# Patient Record
Sex: Female | Born: 1990 | Race: White | Hispanic: No | Marital: Single | Smoking: Never smoker
Health system: Southern US, Community
[De-identification: ages and names within clinical notes are randomized; demographics above are authoritative.]

## PROBLEM LIST (undated history)

## (undated) DIAGNOSIS — S42309A Unspecified fracture of shaft of humerus, unspecified arm, initial encounter for closed fracture: Secondary | ICD-10-CM

## (undated) HISTORY — PX: OTHER SURGICAL HISTORY: SHX169

---

## 2014-12-10 ENCOUNTER — Other Ambulatory Visit (HOSPITAL_COMMUNITY)
Admission: RE | Admit: 2014-12-10 | Discharge: 2014-12-10 | Disposition: A | Discharge status: Home / Self Care | Payer: 59 | Source: Ambulatory Visit | Attending: Obstetrics & Gynecology | Admitting: Obstetrics & Gynecology

## 2014-12-10 DIAGNOSIS — Z113 Encounter for screening for infections with a predominantly sexual mode of transmission: Secondary | ICD-10-CM | POA: Insufficient documentation

## 2014-12-10 DIAGNOSIS — Z01419 Encounter for gynecological examination (general) (routine) without abnormal findings: Secondary | ICD-10-CM | POA: Diagnosis present

## 2014-12-21 ENCOUNTER — Emergency Department (INDEPENDENT_AMBULATORY_CARE_PROVIDER_SITE_OTHER): Payer: 59

## 2014-12-21 ENCOUNTER — Encounter (HOSPITAL_COMMUNITY): Payer: Self-pay | Admitting: Emergency Medicine

## 2014-12-21 ENCOUNTER — Emergency Department (HOSPITAL_COMMUNITY)
Admission: EM | Admit: 2014-12-21 | Discharge: 2014-12-21 | Disposition: A | Discharge status: Home / Self Care | Payer: 59 | Source: Home / Self Care | Attending: Family Medicine | Admitting: Family Medicine

## 2014-12-21 DIAGNOSIS — S93402A Sprain of unspecified ligament of left ankle, initial encounter: Secondary | ICD-10-CM

## 2014-12-21 HISTORY — DX: Unspecified fracture of shaft of humerus, unspecified arm, initial encounter for closed fracture: S42.309A

## 2014-12-21 NOTE — ED Provider Notes (Signed)
CSN: [id number]     Arrival date & time 12/21/14  1618 History   First MD Initiated Contact with Patient 12/21/14 1640     No chief complaint on file.  (Consider location/radiation/quality/duration/timing/severity/associated sxs/prior Treatment) The history is provided by the patient.   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 Last Wednesday when she was playing this sport, she turned her left ankle and she's had pain and swelling ever since. She feels as if it's unstable. She is able to bear her weight as long as she does not markedly dorsiflex her ankle.  No past medical history on file. No past surgical history on file. No family history on file. Social History  Substance Use Topics  . Smoking status: Not on file  . Smokeless tobacco: Not on file  . Alcohol Use: Not on file   OB History    No data available     Review of Systems  Constitutional: Negative.   HENT: Negative.   Respiratory: Negative.   Cardiovascular: Negative.   Genitourinary: Negative.   Neurological: Negative.   Psychiatric/Behavioral: Negative.     Allergies  Review of patient's allergies indicates not on file.  Home Medications   Prior to Admission medications   Not on File   Meds Ordered and Administered this Visit  Medications - No data to display  There were no vitals taken for this visit. No data found.   Physical Exam  Constitutional: She is oriented to person, place, and time. She appears well-developed and well-nourished.  HENT:  Head: Normocephalic and atraumatic.  Right Ear: External ear normal.  Left Ear: External ear normal.  Eyes: Conjunctivae and EOM are normal. Pupils are equal, round, and reactive to light.  Neck: Normal range of motion. Neck supple.  Musculoskeletal:  Left lateral malleolus is swollen and there is an ecchymotic line that parallels the lateral plantar surface of her foot. She does have  some tenderness with palpation of the lateral malleolus but there is no bony abnormality.  Neurological: She is alert and oriented to person, place, and time.  Skin: Skin is warm and dry.  Nursing note and vitals reviewed.   ED Course  Procedures (including critical care time) Left ankle film is negative  MDM   ASO splint for a week, and then when needed for sports  Signed, Elvina Sidle, MD   Elvina Sidle, MD 12/21/14 [phone]

## 2014-12-21 NOTE — ED Notes (Signed)
Reports spraining left ankle on Wednesday.  Patient was wearing cleats when she twisted her ankle.  Visible bruising.  Patient limping with walking/weight bearing

## 2014-12-21 NOTE — Discharge Instructions (Signed)
Use the ankle brace for sports for the next week and then as needed in the future for recurrent sprain    Ankle Sprain An ankle sprain is an injury to the strong, fibrous tissues (ligaments) that hold the bones of your ankle joint together.  CAUSES An ankle sprain is usually caused by a fall or by twisting your ankle. Ankle sprains most commonly occur when you step on the outer edge of your foot, and your ankle turns inward. People who participate in sports are more prone to these types of injuries.  SYMPTOMS   Pain in your ankle. The pain may be present at rest or only when you are trying to stand or walk.  Swelling.  Bruising. Bruising may develop immediately or within 1 to 2 days after your injury.  Difficulty standing or walking, particularly when turning corners or changing directions. DIAGNOSIS  Your caregiver will ask you details about your injury and perform a physical exam of your ankle to determine if you have an ankle sprain. During the physical exam, your caregiver will press on and apply pressure to specific areas of your foot and ankle. Your caregiver will try to move your ankle in certain ways. An X-ray exam may be done to be sure a bone was not broken or a ligament did not separate from one of the bones in your ankle (avulsion fracture).  TREATMENT  Certain types of braces can help stabilize your ankle. Your caregiver can make a recommendation for this. Your caregiver may recommend the use of medicine for pain. If your sprain is severe, your caregiver may refer you to a surgeon who helps to restore function to parts of your skeletal system (orthopedist) or a physical therapist. HOME CARE INSTRUCTIONS   Apply ice to your injury for 1-2 days or as directed by your caregiver. Applying ice helps to reduce inflammation and pain.  Put ice in a plastic bag.  Place a towel between your skin and the bag.  Leave the ice on for 15-20 minutes at a time, every 2 hours while you are  awake.  Only take over-the-counter or prescription medicines for pain, discomfort, or fever as directed by your caregiver.  Elevate your injured ankle above the level of your heart as much as possible for 2-3 days.  If your caregiver recommends crutches, use them as instructed. Gradually put weight on the affected ankle. Continue to use crutches or a cane until you can walk without feeling pain in your ankle.  If you have a plaster splint, wear the splint as directed by your caregiver. Do not rest it on anything harder than a pillow for the first 24 hours. Do not put weight on it. Do not get it wet. You may take it off to take a shower or bath.  You may have been given an elastic bandage to wear around your ankle to provide support. If the elastic bandage is too tight (you have numbness or tingling in your foot or your foot becomes cold and blue), adjust the bandage to make it comfortable.  If you have an air splint, you may blow more air into it or let air out to make it more comfortable. You may take your splint off at night and before taking a shower or bath. Wiggle your toes in the splint several times per day to decrease swelling. SEEK MEDICAL CARE IF:   You have rapidly increasing bruising or swelling.  Your toes feel extremely cold or you lose feeling  in your foot.  Your pain is not relieved with medicine. SEEK IMMEDIATE MEDICAL CARE IF:  Your toes are numb or blue.  You have severe pain that is increasing. MAKE SURE YOU:   Understand these instructions.  Will watch your condition.  Will get help right away if you are not doing well or get worse. Document Released: 03/28/2005 Document Revised: 12/21/2011 Document Reviewed: 04/09/2011 [facility] Patient Information 2015 [place], [place]. This information is not intended to replace advice given to you by your health care provider. Make sure you discuss any questions you have with your health care provider.

## 2015-05-04 ENCOUNTER — Encounter (HOSPITAL_COMMUNITY): Payer: Self-pay

## 2015-05-04 ENCOUNTER — Emergency Department (HOSPITAL_COMMUNITY)
Admission: EM | Admit: 2015-05-04 | Discharge: 2015-05-04 | Disposition: A | Discharge status: Home / Self Care | Payer: Self-pay | Attending: Emergency Medicine | Admitting: Emergency Medicine

## 2015-05-04 ENCOUNTER — Emergency Department (HOSPITAL_COMMUNITY): Payer: Self-pay

## 2015-05-04 DIAGNOSIS — Y998 Other external cause status: Secondary | ICD-10-CM | POA: Insufficient documentation

## 2015-05-04 DIAGNOSIS — Y9289 Other specified places as the place of occurrence of the external cause: Secondary | ICD-10-CM | POA: Insufficient documentation

## 2015-05-04 DIAGNOSIS — M25561 Pain in right knee: Secondary | ICD-10-CM

## 2015-05-04 DIAGNOSIS — S8991XA Unspecified injury of right lower leg, initial encounter: Secondary | ICD-10-CM | POA: Insufficient documentation

## 2015-05-04 DIAGNOSIS — Y9369 Activity, other involving other sports and athletics played as a team or group: Secondary | ICD-10-CM | POA: Insufficient documentation

## 2015-05-04 DIAGNOSIS — W500XXA Accidental hit or strike by another person, initial encounter: Secondary | ICD-10-CM | POA: Insufficient documentation

## 2015-05-04 MED ORDER — OXYCODONE-ACETAMINOPHEN 5-325 MG PO TABS: 1.0000 | ORAL_TABLET | Freq: Four times a day (QID) | ORAL | Status: DC | PRN

## 2015-05-04 NOTE — ED Notes (Signed)
Pt was playing a contact sport and collided with another player, R knee twisted inward, pain 5/10, pt states she has not been abel to walk on it.

## 2015-05-04 NOTE — Progress Notes (Signed)
Orthopedic Tech Progress Note Patient Details:  Veronica Robertson 1990-06-30 [id number]  Ortho Devices Type of Ortho Device: Knee Immobilizer Ortho Device/Splint Location: rle Ortho Device/Splint Interventions: Ordered, Application   Trinna Post 05/04/2015, 11:13 PM

## 2015-05-04 NOTE — Discharge Instructions (Signed)
Call orthopedic clinic today or tomorrow to schedule an appointment. Let them know you were seen in the ER and told to follow up at the earliest appointment.   Wear knee immobilizer until you are seen by the orthopedic clinic listed. Use crutches as needed for comfort. Ice and elevate knee throughout the day. Alternate between ibuprofen/motrin and pain medication for pain relief. Do not drive or operate machinery with pain medication use. Return to ER for any new or worsening symptoms, any additional concerns.

## 2015-05-04 NOTE — ED Provider Notes (Signed)
CSN: [id number]     Arrival date & time 05/04/15  2143 History  By signing my name below, I, Gonzella Lex, attest that this documentation has been prepared under the direction and in the presence of [facility], PA-C. Electronically Signed: Gonzella Lex, Scribe. 05/04/2015. 10:36 PM.   Chief Complaint  Patient presents with  . Knee Pain   The history is provided by the patient. No language interpreter was used.   HPI Comments: Veronica Robertson is a 25 y.o. female who presents to the Emergency Department complaining of sudden onset, constant, right knee pain following a collision with a significantly larger female while playing a coed contact sport an hour and a half ago. Pt reports associated swelling and states that her knee pain is worse laterally. She also notes that her right knee twisted inward but is unsure of any sort of pop due to the noise around her at the time of the injury. Unable to bear weight. No history of prior knee surgeries/injuries.  Past Medical History  Diagnosis Date  . Broken arm     left   History reviewed. No pertinent past surgical history. No family history on file. Social History  Substance Use Topics  . Smoking status: Never Smoker   . Smokeless tobacco: None  . Alcohol Use: Yes   OB History    No data available     Review of Systems  Constitutional: Negative for fever.  HENT: Negative for congestion.   Eyes: Negative for visual disturbance.  Respiratory: Negative for shortness of breath.   Cardiovascular: Negative for chest pain.  Gastrointestinal: Negative for abdominal pain.  Genitourinary: Negative for difficulty urinating.  Musculoskeletal: Positive for myalgias, joint swelling and arthralgias.  Allergic/Immunologic: Negative for immunocompromised state.  Neurological: Negative for syncope.    Allergies  Review of patient's allergies indicates no known allergies.  Home Medications   Prior to Admission medications    Medication Sig Start Date End Date Taking? Authorizing Provider  oxyCODONE-acetaminophen (PERCOCET/ROXICET) 5-325 MG tablet Take 1 tablet by mouth every 6 (six) hours as needed for severe pain. 05/04/15   Jaime Pilcher Ward, PA-C   BP 114/69 mmHg  Pulse 83  Temp(Src) 98.5 F (36.9 C) (Oral)  Resp 20  Ht  (1.626 m)  Wt 82.555 kg  BMI 31.22 kg/m2  SpO2 100%  LMP 04/27/2015 Physical Exam  Constitutional: She is oriented to person, place, and time. She appears well-developed and well-nourished. No distress.  HENT:  Head: Normocephalic and atraumatic.  Neck: Neck supple. No tracheal deviation present.  Cardiovascular: Normal rate, regular rhythm and normal heart sounds.   Pulmonary/Chest: Effort normal and breath sounds normal. No respiratory distress. She has no wheezes. She has no rales.  Abdominal: Soft. She exhibits no distension. There is no tenderness.  Musculoskeletal:       Right knee: She exhibits decreased range of motion and swelling. She exhibits no effusion, no ecchymosis, no deformity, no laceration and no erythema. Tenderness found. Lateral joint line and LCL tenderness noted. No patellar tendon tenderness noted.  + McMurray of right knee (-) anterior and posterior drawers  Neurological: She is alert and oriented to person, place, and time.  Skin: Skin is warm and dry. No rash noted.  Psychiatric: She has a normal mood and affect.  Nursing note and vitals reviewed.   ED Course  Procedures  DIAGNOSTIC STUDIES:    Oxygen Saturation is 100% on RA, normal by my interpretation.   COORDINATION OF  CARE:  10:36 PM Will review x ray. Discussed treatment plan with pt at bedside and pt agreed to plan.   Imaging Review Dg Knee Complete 4 Views Right  05/04/2015  CLINICAL DATA:  Twisting injury right knee today. Initial encounter. EXAM: RIGHT KNEE - COMPLETE 4+ VIEW COMPARISON:  None. FINDINGS: There is no evidence of fracture, dislocation, or joint effusion. There is  no evidence of arthropathy or other focal bone abnormality. Soft tissues are unremarkable. IMPRESSION: Negative exam. Electronically Signed   By: Drusilla Kanner M.D.   On: 05/04/2015 22:31   I have personally reviewed and evaluated these images as part of my medical decision-making.    MDM   Final diagnoses:  Right knee pain   Veronica Robertson presents with right knee pain and swelling after injury while playing contact sport today. Patient with negative x-ray. Exam shows TTP of lateral joint line and tenderness along LCL. + McMurray. Patient placed in knee immobilizer and advised to call orthopedic clinic first thing in the morning for appointment with them for further evaluation and management. Home care instructions given. Patient states they have crutches at home and will therefore use their own. Return and follow up instructions discussed - all questions answered.   I personally performed the services described in this documentation, which was scribed in my presence. The recorded information has been reviewed and is accurate.  [facility] Ward, PA-C 05/04/15 2313  Vanetta Mulders, MD 05/05/15 1630

## 2015-05-20 ENCOUNTER — Other Ambulatory Visit: Payer: Self-pay | Admitting: Sports Medicine

## 2015-05-20 DIAGNOSIS — M25561 Pain in right knee: Secondary | ICD-10-CM

## 2015-05-24 ENCOUNTER — Ambulatory Visit
Admission: RE | Admit: 2015-05-24 | Discharge: 2015-05-24 | Disposition: A | Discharge status: Home / Self Care | Payer: BLUE CROSS/BLUE SHIELD | Source: Ambulatory Visit | Attending: Sports Medicine | Admitting: Sports Medicine

## 2015-05-24 DIAGNOSIS — M25561 Pain in right knee: Secondary | ICD-10-CM

## 2016-05-30 IMAGING — MR MR KNEE*R* W/O CM
6 series · 34 of 40 positions shown · non-contrast
Comparison: Radiographs 05/04/2015.

CLINICAL DATA: Lateral stress injury to the knee 3 weeks ago with
limited range of motion and pain. No previous relevant surgery.

EXAM:
MRI OF THE RIGHT KNEE WITHOUT CONTRAST
TECHNIQUE: Multiplanar, multisequence MR imaging of the knee was performed. No
intravenous contrast was administered.

[Series 6: PD fat-sat · axial · right · 3.0mm · 0.39mm/px · z∈[-61,+53]mm · 7 of 36 slices shown (1 of 3)]
[im 1/36]
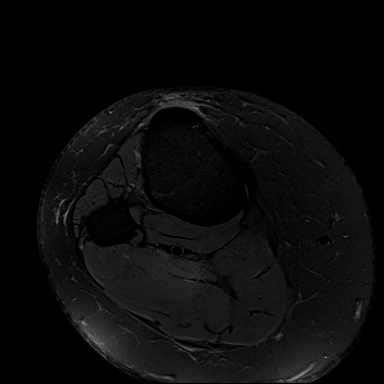
[im 6/36]
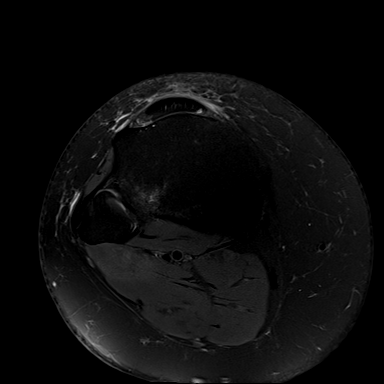
[im 12/36]
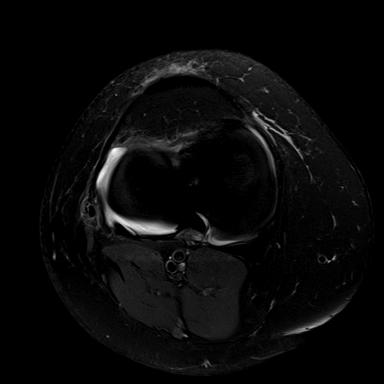
[im 18/36]
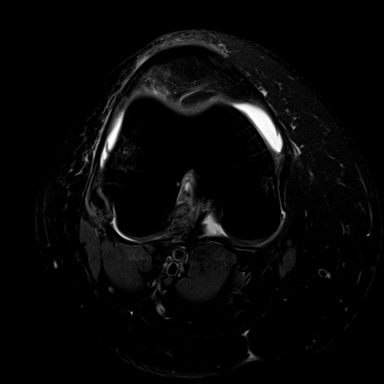
[im 24/36]
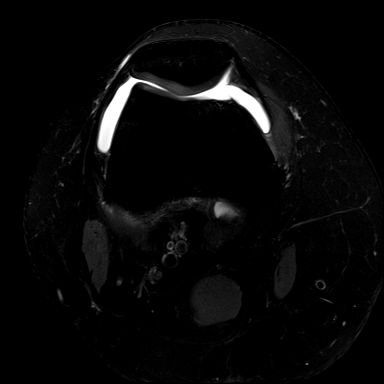
[im 30/36]
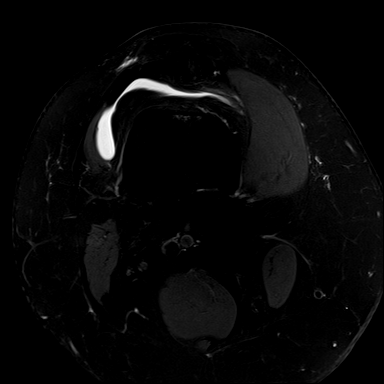
[im 36/36]
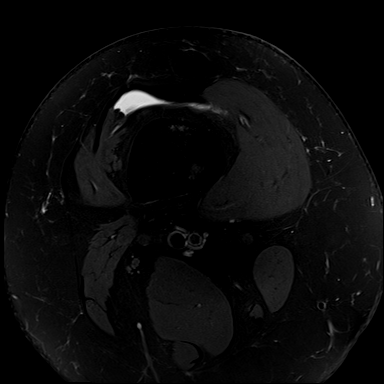

[Series 7: T1 · coronal · right · 3.0mm · 0.33mm/px · 1 of 29 slices shown]
[im 1/29]
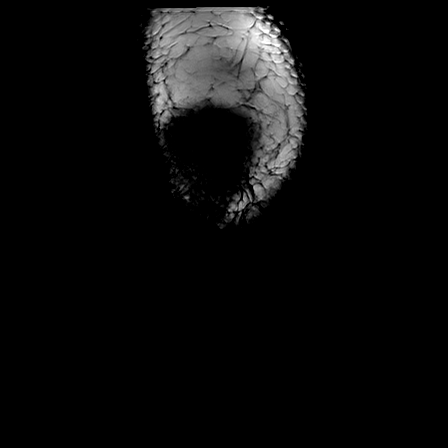

[Series 8: PD fat-sat · coronal · right · 3.0mm · 0.39mm/px · 8 of 33 slices shown (2 of 3)]
[im 1/33]
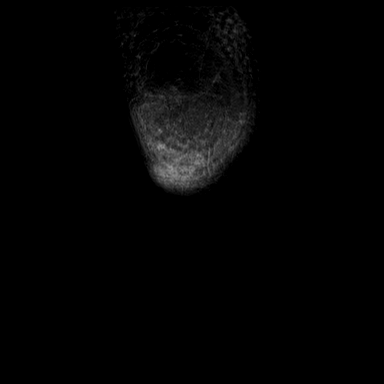
[im 5/33]
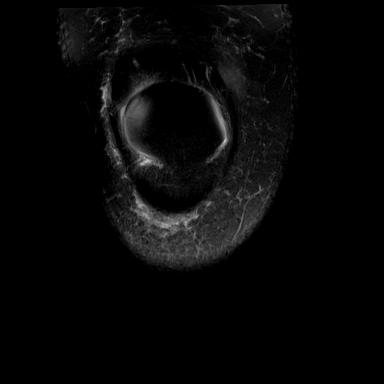
[im 10/33]
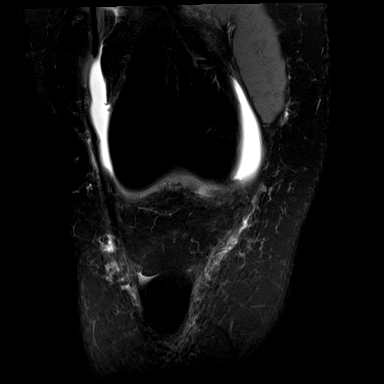
[im 14/33]
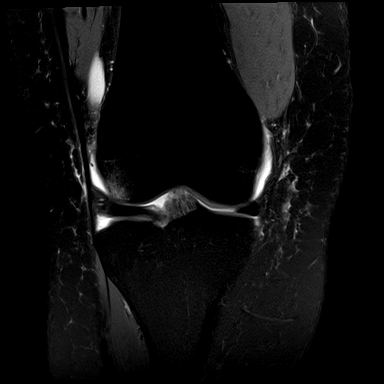
[im 19/33]
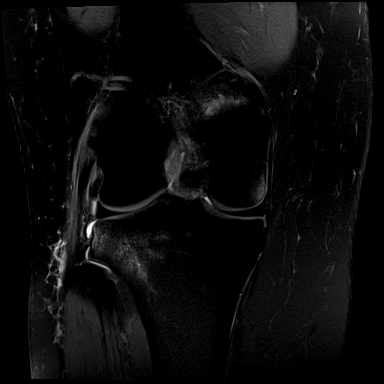
[im 23/33]
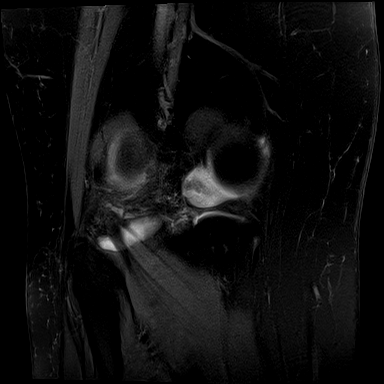
[im 28/33]
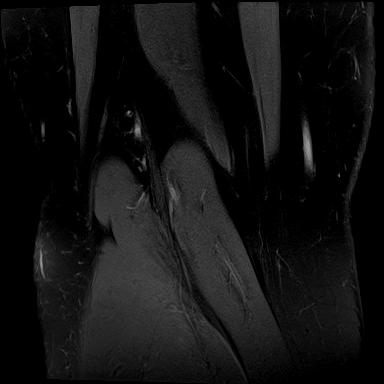
[im 33/33]
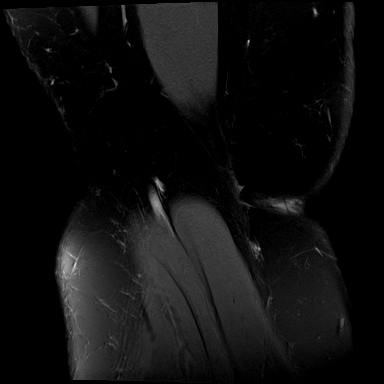

[Series 9: PD fat-sat · sagittal · right · 3.0mm · 0.39mm/px · 6 of 26 slices shown (3 of 3)]
[im 1/26]
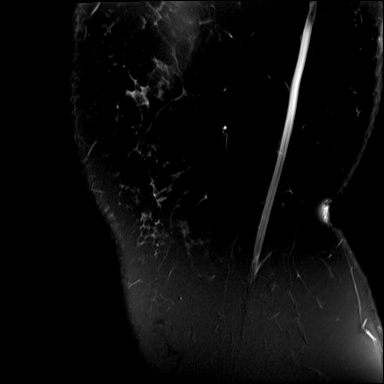
[im 6/26]
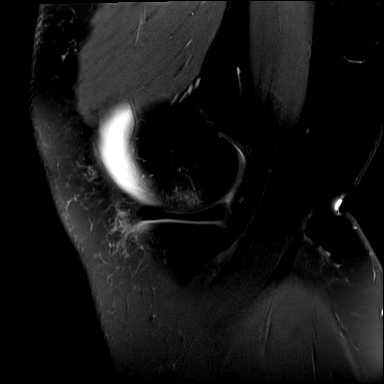
[im 11/26]
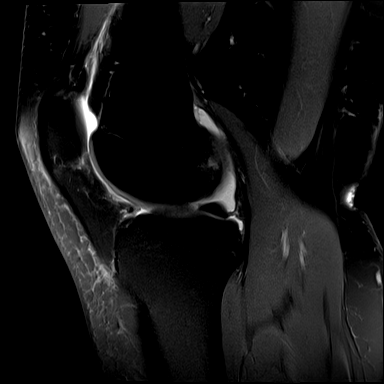
[im 16/26]
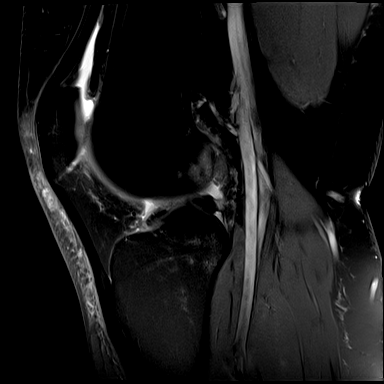
[im 21/26]
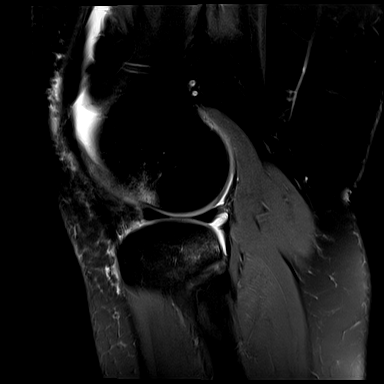
[im 26/26]
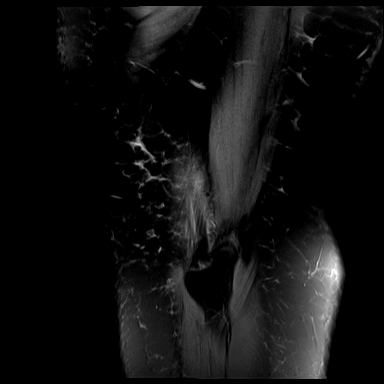

[Series 10: T2 fat-sat · coronal · right · 3.0mm · 0.39mm/px · 8 of 33 slices shown]
[im 1/33]
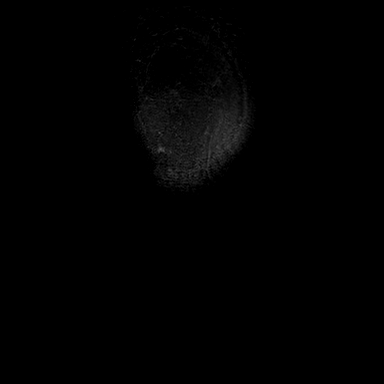
[im 5/33]
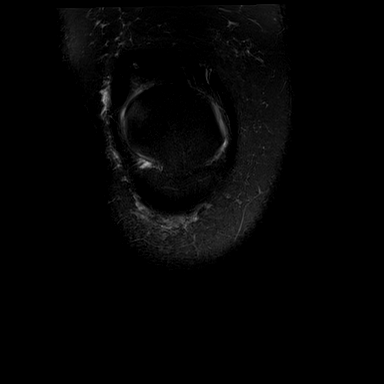
[im 10/33]
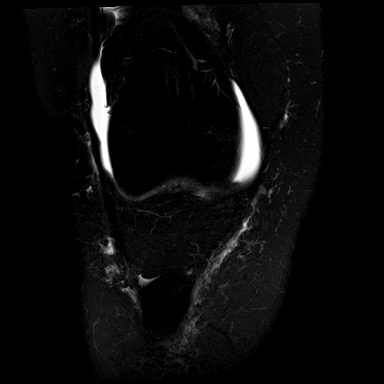
[im 14/33]
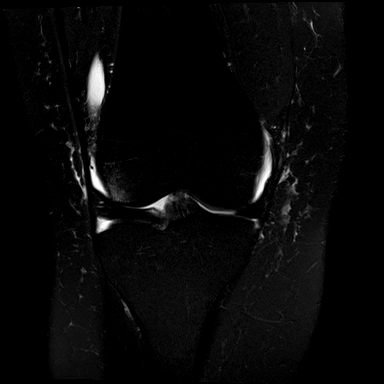
[im 19/33]
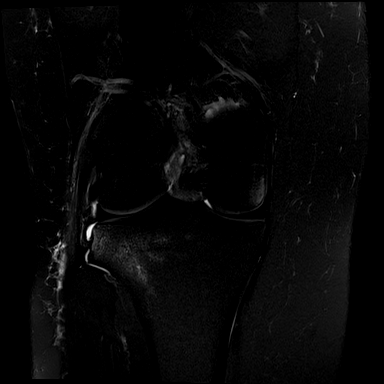
[im 23/33]
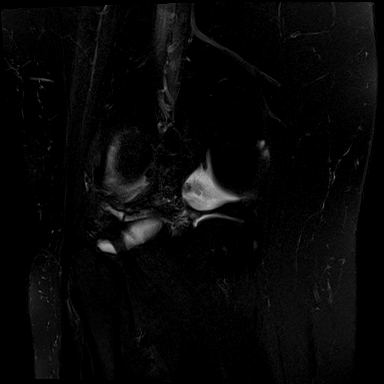
[im 28/33]
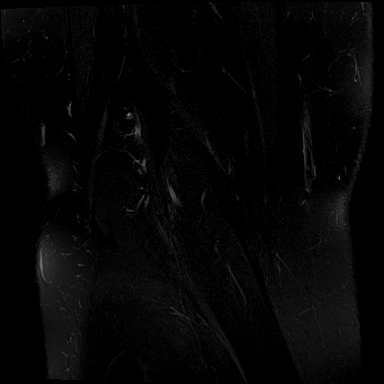
[im 33/33]
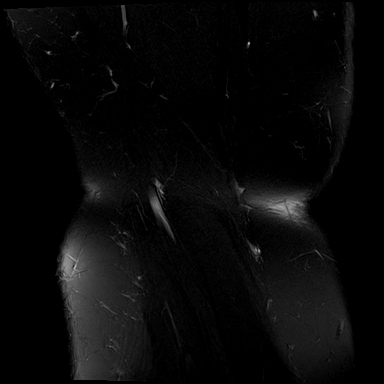

[Series 11: PD · coronal · right · 2.0mm · 0.44mm/px · 4 of 17 slices shown]
[im 1/17]
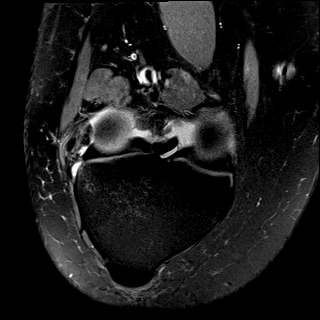
[im 6/17]
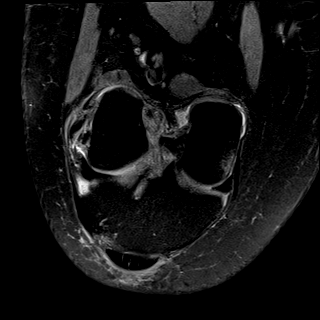
[im 11/17]
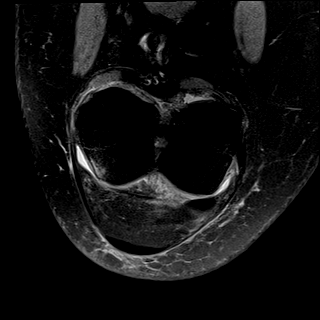
[im 17/17]
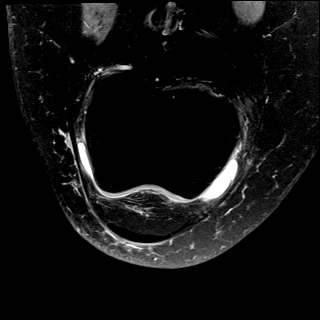

[34 of 40 positions shown; findings below may reference images not displayed]

FINDINGS: MENISCI

Medial meniscus:  Intact with normal morphology.

Lateral meniscus:  Intact with normal morphology.

LIGAMENTS

Cruciates: The anterior cruciate ligament is completely torn. The
PCL is intact. There is edema/ hemorrhage in the intercondylar
notch.

Collaterals: The medial collateral ligament appears normal. The
fibular collateral ligament is thickened with surrounding edema. The
iliotibial band, biceps tendon and popliteus tendon appear normal.

CARTILAGE

Patellofemoral:  Preserved.

Medial:  Preserved.

Lateral:  Preserved.

OTHER

Joint:  Moderate-sized joint effusion.

Popliteal Fossa:  Unremarkable. No significant Baker's cyst.

Extensor Mechanism: Intact. There is prominent subcutaneous edema
anterior to the patella and patellar tendon.

Bones: There are acute bone contusions involving the lateral femoral
condyle anteriorly, the medial femoral condyle peripherally and the
lateral tibial plateau posteriorly. No cortical fracture or
osteochondral injury identified.
IMPRESSION: 1. Acute complete tear of the anterior cruciate ligament.
2. Grade 2 fibular collateral ligament injury.
3. Associated typical osseous injuries of pivot-shift mechanism with
hemarthrosis.
4. The MCL, PCL and menisci appear intact.

## 2016-08-22 ENCOUNTER — Ambulatory Visit (INDEPENDENT_AMBULATORY_CARE_PROVIDER_SITE_OTHER): Payer: BC Managed Care – PPO | Admitting: Orthopaedic Surgery

## 2016-08-22 ENCOUNTER — Encounter (INDEPENDENT_AMBULATORY_CARE_PROVIDER_SITE_OTHER): Payer: Self-pay | Admitting: Orthopaedic Surgery

## 2016-08-22 DIAGNOSIS — S83511A Sprain of anterior cruciate ligament of right knee, initial encounter: Secondary | ICD-10-CM | POA: Diagnosis not present

## 2016-08-22 NOTE — Progress Notes (Signed)
   Office Visit Note   Patient: Veronica Robertson           Date of Birth: [birth year]           MRN: [id number] Visit Date: 08/22/2016              Requested by: No referring provider defined for this encounter. 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 Follow-up as needed.  Follow-Up Instructions: Return if symptoms worsen or fail to improve.   Orders:  No orders of the defined types were placed in this encounter.  No orders of the defined types were placed in this encounter.     Procedures: No procedures performed   Clinical Data: No additional findings.   Subjective: Chief Complaint  Patient presents with  . Right Knee - Pain    Patient comes in today for follow-up of her right chronic anterior cruciate ligament rupture. She finished physical therapy and is wearing an anterior cruciate ligament brace. She denies any pain. Occasionally she'll have some instability. She is able to participate in a roller derby without any issues. She is mainly here to see if she can get another brace will fit better. She has lost weight recently.    Review of Systems  Constitutional: Negative.   HENT: Negative.   Eyes: Negative.   Respiratory: Negative.   Cardiovascular: Negative.   Endocrine: Negative.   Musculoskeletal: Negative.   Neurological: Negative.   Hematological: Negative.   Psychiatric/Behavioral: Negative.   All other systems reviewed and are negative.    Objective: Vital Signs: There were no vitals taken for this visit.  Physical Exam  Ortho Exam Right knee exam is benign. Lachman testing is stable. Specialty Comments:  No specialty comments available.  Imaging: No results found.   PMFS History: Patient Active Problem List   Diagnosis Date Noted  . 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 Broken arm    left    No family history on file.  No past surgical history on file. Social History   Occupational History  . Not on file.   Social History Main Topics  . Smoking status: Never Smoker  . Smokeless tobacco: Never Used  . Alcohol use Yes  . Drug use: No  . Sexual activity: Not on file

## 2016-09-09 ENCOUNTER — Telehealth (INDEPENDENT_AMBULATORY_CARE_PROVIDER_SITE_OTHER): Payer: Self-pay | Admitting: *Deleted

## 2016-09-09 NOTE — Telephone Encounter (Signed)
Patient called in this afternoon in regards to needing to get in contact with [facility]? To get fitted for a new brace because she has recently lost a lot of weight and needs to be fitted for a new brace. Her CB # [phone]. Thank you

## 2016-09-12 NOTE — Telephone Encounter (Signed)
ACL brace

## 2016-09-12 NOTE — Telephone Encounter (Signed)
Emailed ryan. He will get in contact with patient. Called pt to advise, no answer LMOM with details.

## 2016-09-12 NOTE — Telephone Encounter (Signed)
Is this okay if so what would you like the order to say?

## 2017-05-07 ENCOUNTER — Emergency Department (HOSPITAL_BASED_OUTPATIENT_CLINIC_OR_DEPARTMENT_OTHER): Payer: BC Managed Care – PPO

## 2017-05-07 ENCOUNTER — Encounter (HOSPITAL_BASED_OUTPATIENT_CLINIC_OR_DEPARTMENT_OTHER): Payer: Self-pay | Admitting: Emergency Medicine

## 2017-05-07 ENCOUNTER — Other Ambulatory Visit: Payer: Self-pay

## 2017-05-07 ENCOUNTER — Emergency Department (HOSPITAL_BASED_OUTPATIENT_CLINIC_OR_DEPARTMENT_OTHER)
Admission: EM | Admit: 2017-05-07 | Discharge: 2017-05-07 | Disposition: A | Discharge status: Home / Self Care | Payer: BC Managed Care – PPO | Attending: Emergency Medicine | Admitting: Emergency Medicine

## 2017-05-07 DIAGNOSIS — S4351XA Sprain of right acromioclavicular joint, initial encounter: Secondary | ICD-10-CM

## 2017-05-07 DIAGNOSIS — Y92838 Other recreation area as the place of occurrence of the external cause: Secondary | ICD-10-CM | POA: Insufficient documentation

## 2017-05-07 DIAGNOSIS — R52 Pain, unspecified: Secondary | ICD-10-CM

## 2017-05-07 DIAGNOSIS — Y998 Other external cause status: Secondary | ICD-10-CM | POA: Insufficient documentation

## 2017-05-07 DIAGNOSIS — Y9351 Activity, roller skating (inline) and skateboarding: Secondary | ICD-10-CM | POA: Insufficient documentation

## 2017-05-07 DIAGNOSIS — S4991XA Unspecified injury of right shoulder and upper arm, initial encounter: Secondary | ICD-10-CM | POA: Diagnosis present

## 2017-05-07 DIAGNOSIS — W500XXA Accidental hit or strike by another person, initial encounter: Secondary | ICD-10-CM | POA: Diagnosis not present

## 2017-05-07 MED ORDER — IBUPROFEN 600 MG PO TABS: 600.0000 mg | ORAL_TABLET | Freq: Four times a day (QID) | ORAL | 0 refills | Status: DC | PRN

## 2017-05-07 MED ORDER — HYDROCODONE-ACETAMINOPHEN 5-325 MG PO TABS: 1.0000 | ORAL_TABLET | ORAL | 0 refills | Status: DC | PRN

## 2017-05-07 MED ORDER — KETOROLAC TROMETHAMINE 30 MG/ML IJ SOLN
30.0000 mg | Freq: Once | INTRAMUSCULAR | Status: AC
  Administered 2017-05-07: 30 mg via INTRAMUSCULAR
  Filled 2017-05-07: qty 1

## 2017-05-07 NOTE — ED Triage Notes (Signed)
Pt was playing roller derby today around 11 am, causing injury to left shoulder.

## 2017-05-07 NOTE — ED Provider Notes (Signed)
[facility] EMERGENCY DEPARTMENT Provider Note   CSN: [id number] Arrival date & time: 05/07/17  1256     History   Chief Complaint Chief Complaint  Patient presents with  . Shoulder Injury    HPI Veronica Robertson is a 27 y.o. female.  Pt presents to the ED today with right shoulder pain.  Pt was playing roller derby today around 1100.  She said another player ran into her and she felt a pain to her shoulder.  It hurts to move the shoulder.  She is right handed.      Past Medical History:  Diagnosis Date  . Broken arm    left    Patient Active Problem List   Diagnosis Date Noted  . 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 arm surgery      OB History    No data available       Home Medications    Prior to Admission medications   Medication Sig Start Date End Date Taking? Authorizing Provider  oxyCODONE-acetaminophen (PERCOCET/ROXICET) 5-325 MG tablet Take 1 tablet by mouth every 6 (six) hours as needed for severe pain. 05/04/15  Yes Ward, Chase Picket, PA-C  HYDROcodone-acetaminophen (NORCO/VICODIN) 5-325 MG tablet Take 1 tablet by mouth every 4 (four) hours as needed. 05/07/17   Jacalyn Lefevre, MD  ibuprofen (ADVIL,MOTRIN) 600 MG tablet Take 1 tablet (600 mg total) by mouth every 6 (six) hours as needed. 05/07/17   Jacalyn Lefevre, MD    Family History No family history on file.  Social History Social History   Tobacco Use  . Smoking status: Never Smoker  . Smokeless tobacco: Never Used  Substance Use Topics  . Alcohol use: Yes  . Drug use: No     Allergies   Patient has no known allergies.   Review of Systems Review of Systems  Musculoskeletal:       Right shoulder pain  All other systems reviewed and are negative.    Physical Exam Updated Vital Signs BP 109/80   Pulse 62   Temp 98.5 F (36.9 C) (Oral)   Resp 18   Ht 5\' 4"  (1.626 m)   Wt 74.8 kg (165  lb)   LMP 04/25/2017   SpO2 99%   BMI 28.32 kg/m   Physical Exam  Constitutional: She is oriented to person, place, and time. She appears well-developed and well-nourished.  HENT:  Head: Normocephalic and atraumatic.  Right Ear: External ear normal.  Left Ear: External ear normal.  Nose: Nose normal.  Mouth/Throat: Oropharynx is clear and moist.  Eyes: Conjunctivae and EOM are normal. Pupils are equal, round, and reactive to light.  Neck: Normal range of motion. Neck supple.  Cardiovascular: Normal rate, regular rhythm, normal heart sounds and intact distal pulses.  Pulmonary/Chest: Effort normal and breath sounds normal.  Abdominal: Soft. Bowel sounds are normal.  Musculoskeletal:       Right shoulder: She exhibits decreased range of motion and tenderness.  Neurological: She is alert and oriented to person, place, and time.  Skin: Skin is warm and dry. Capillary refill takes less than 2 seconds.  Psychiatric: She has a normal mood and affect. Her behavior is normal. Judgment and thought content normal.  Nursing note and vitals reviewed.    ED Treatments / Results  Labs (all labs ordered are listed, but only abnormal results are displayed) Labs Reviewed - No data to display  EKG  EKG Interpretation None       Radiology Dg Shoulder Right  Result Date: 05/07/2017 CLINICAL DATA:  Patient with right shoulder injury and pain. Initial encounter. EXAM: RIGHT SHOULDER - 2+ VIEW COMPARISON:  None. FINDINGS: There is mild elevation of the distal clavicle in relation to the acromion. No evidence for acute fracture. Visualized right hemithorax unremarkable. IMPRESSION: Mild elevation of the distal clavicle in relation to the acromion. AC joint sprain not excluded. No evidence for acute fracture. Electronically Signed   By: Annia Beltrew  Davis M.D.   On: 05/07/2017 14:06    Procedures Procedures (including critical care time)  Medications Ordered in ED Medications  ketorolac (TORADOL)  30 MG/ML injection 30 mg (not administered)     Initial Impression / Assessment and Plan / ED Course  I have reviewed the triage vital signs and the nursing notes.  Pertinent labs & imaging results that were available during my care of the patient were reviewed by me and considered in my medical decision making (see chart for details).  Pt given dose of toradol prior to d/c.  She is instructed to f/u with ortho.  Return if worse.  Final Clinical Impressions(s) / ED Diagnoses   Final diagnoses:  Sprain of right acromioclavicular ligament, initial encounter    ED Discharge Orders        Ordered    ibuprofen (ADVIL,MOTRIN) 600 MG tablet  Every 6 hours PRN     05/07/17 1530    HYDROcodone-acetaminophen (NORCO/VICODIN) 5-325 MG tablet  Every 4 hours PRN     05/07/17 1530       Jacalyn LefevreHaviland, Ibraheem Voris, MD 05/07/17 1533

## 2017-05-07 NOTE — ED Notes (Signed)
Pt given d/c instructions as per chart. Rx x 2 with precautions. Verbalizes understanding. No questions. 

## 2017-05-16 ENCOUNTER — Telehealth: Payer: Self-pay | Admitting: Family Medicine

## 2017-05-16 NOTE — Telephone Encounter (Signed)
Copied from CRM [id number]. Topic: Appointment Scheduling - Scheduling Inquiry for Clinic >> May 16, 2017  6:23 PM Stephannie LiSimmons, [place] L, [place] wrote: Reason for CRM: Patient called has a sprained  shoulder she prefers an afternoon appointment  due to work ,and  she is scheduled for  06/07/17 at 315 can she have an earlier appointment with Dr Katrinka BlazingSmith, if so please call [phone]

## 2017-05-22 NOTE — Telephone Encounter (Signed)
lmovm for pt to return call.  

## 2017-06-06 NOTE — Progress Notes (Signed)
Veronica ScaleZach Markiah Robertson D.O. [facility] [address] [place], [place] [address] Phone: [phone] Subjective:    I'm seeing this patient by the request  of:    CC: Right shoulder pain  EXB:[id number]:Subjective  Veronica ConnorsMichelle Robertson is a 27 y.o. female coming in with complaint of right shoulder pain. She suffered an injury on January 27th where she took a direct hit to the right shoulder. She wore a sling for a week. She continues to have mild uncomfortable sensation. She notices pain with overhead motions. She has not returned to full participation. No surgical history. Patient feels that she has laxity in her joints.       Past Medical History:  Diagnosis Date  . Broken arm    left   Past Surgical History:  Procedure Laterality Date  . arm surgery     Social History   Socioeconomic History  . Marital status: Single    Spouse name: Not on file  . Number of children: Not on file  . Years of education: Not on file  . Highest education level: Not on file  Social Needs  . Financial resource strain: Not on file  . Food insecurity - worry: Not on file  . Food insecurity - inability: Not on file  . Transportation needs - medical: Not on file  . Transportation needs - non-medical: Not on file  Occupational History  . Not on file  Tobacco Use  . Smoking status: Never Smoker  . Smokeless tobacco: Never Used  Substance and Sexual Activity  . Alcohol use: Yes  . Drug use: No  . Sexual activity: Not on file  Other Topics Concern  . Not on file  Social History Narrative  . Not on file   No Known Allergies No family history on file.   Past medical history, social, surgical and family history all reviewed in electronic medical record.  No pertanent information unless stated regarding to the chief complaint.   Review of Systems:Review of systems updated and as accurate as of 06/06/17  No headache, visual changes, nausea, vomiting, diarrhea, constipation, dizziness, abdominal pain,  skin rash, fevers, chills, night sweats, weight loss, swollen lymph nodes, body aches, joint swelling, muscle aches, chest pain, shortness of breath, mood changes.   Objective  There were no vitals taken for this visit. Systems examined below as of 06/06/17   General: No apparent distress alert and oriented x3 mood and affect normal, dressed appropriately.  HEENT: Pupils equal, extraocular movements intact  Respiratory: Patient's speak in full sentences and does not appear short of breath  Cardiovascular: No lower extremity edema, non tender, no erythema  Skin: Warm dry intact with no signs of infection or rash on extremities or on axial skeleton.  Abdomen: Soft nontender  Neuro: Cranial nerves II through XII are intact, neurovascularly intact in all extremities with 2+ DTRs and 2+ pulses.  Lymph: No lymphadenopathy of posterior or anterior cervical chain or axillae bilaterally.  Gait normal with good balance and coordination.  MSK:  Non tender with full range of motion and good stability and symmetric strength and tone of  elbows, wrist, hip, knee and ankles bilaterally.  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 Palpation over the [facility] joint ROM is full in all planes passively. Rotator cuff strength normal throughout. signs of impingement with positive Neer and Hawkin's tests, but negative empty can sign. Speeds and  Yergason's tests normal. No labral pathology noted with negative Obrien's, negative clunk and good stability.  Positive crossover test pain over the distal clavicle and the acromioclavicular joint Normal scapular function observed. No painful arc and no drop arm sign. No apprehension sign Contralateral shoulder unremarkable  MSK US performed of: Right This study was ordered, performed, and interpreted by Terrilee Files D.O.  Shoulder:   Supraspinatus:  Appears normal  on long and transverse views,  AC joint: Distal clavicle fracture with mild avulsion noted.  Slight distention noted Glenohumeral Joint:  Appears normal without effusion. Glenoid Labrum:  Intact without visualized tears. Biceps Tendon:  Appears normal on long and transverse views, no fraying of tendon, tendon located in intertubercular groove, no subluxation with shoulder internal or external rotation.  Impression: Distal clavicle fracture      Impression and Recommendations:     This case required medical decision making of moderate complexity.      Note: This dictation was prepared with Dragon dictation along with smaller phrase technology. Any transcriptional errors that result from this process are unintentional.

## 2017-06-07 ENCOUNTER — Encounter: Payer: Self-pay | Admitting: Family Medicine

## 2017-06-07 ENCOUNTER — Ambulatory Visit (INDEPENDENT_AMBULATORY_CARE_PROVIDER_SITE_OTHER): Payer: BC Managed Care – PPO | Admitting: Family Medicine

## 2017-06-07 ENCOUNTER — Ambulatory Visit: Payer: Self-pay

## 2017-06-07 ENCOUNTER — Other Ambulatory Visit: Payer: Self-pay

## 2017-06-07 VITALS — BP 100/60 | HR 82 | Ht 64.0 in | Wt 165.0 lb

## 2017-06-07 DIAGNOSIS — G8929 Other chronic pain: Secondary | ICD-10-CM | POA: Diagnosis not present

## 2017-06-07 DIAGNOSIS — M25511 Pain in right shoulder: Principal | ICD-10-CM

## 2017-06-07 DIAGNOSIS — S42034A Nondisplaced fracture of lateral end of right clavicle, initial encounter for closed fracture: Secondary | ICD-10-CM

## 2017-06-07 DIAGNOSIS — S42033A Displaced fracture of lateral end of unspecified clavicle, initial encounter for closed fracture: Secondary | ICD-10-CM | POA: Insufficient documentation

## 2017-06-07 MED ORDER — VITAMIN D (ERGOCALCIFEROL) 1.25 MG (50000 UNIT) PO CAPS: 50000.0000 [IU] | ORAL_CAPSULE | ORAL | 0 refills | Status: DC

## 2017-06-07 NOTE — Patient Instructions (Addendum)
Good to see you  Distal clavicle fracture Ice 20 minutes 2 times daily. Usually after activity and before bed. Once weekly vitamin D for 12 weeks K2 any dose over the counter.  Ok to practice See me again in 3 weeks

## 2017-06-07 NOTE — Assessment & Plan Note (Signed)
Distal clavicle fracture.  Once weekly vitamin D discussed over-the-counter medication.  Discussed icing regimen and home exercises.  Patient will avoid any impact for another 3 weeks.  Follow-up after that.

## 2017-06-25 NOTE — Progress Notes (Signed)
Tawana ScaleZach Jarquis Walker D.O. [facility] [address] [place], [place] [address] Phone: [phone] Subjective:       CC: Wrist pain follow-up  LKG:[id number]:Subjective  Veronica ConnorsMichelle Robertson is a 27 y.o. female coming in with complaint of.  Was found to have a distal clavicle fracture.  Started once weekly vitamin D, icing regimen and range of motion exercises.  Patient states that her pain is now achy if she has slept on the right side.      Past Medical History:  Diagnosis Date  . Broken arm    left   Past Surgical History:  Procedure Laterality Date  . arm surgery     Social History   Socioeconomic History  . Marital status: Single    Spouse name: None  . Number of children: None  . Years of education: None  . Highest education level: None  Social Needs  . Financial resource strain: None  . Food insecurity - worry: None  . Food insecurity - inability: None  . Transportation needs - medical: None  . Transportation needs - non-medical: None  Occupational History  . None  Tobacco Use  . Smoking status: Never Smoker  . Smokeless tobacco: Never Used  Substance and Sexual Activity  . Alcohol use: Yes  . Drug use: No  . Sexual activity: None  Other Topics Concern  . None  Social History Narrative  . None   No Known Allergies No family history on file.  No family history of autoimmune   Past medical history, social, surgical and family history all reviewed in electronic medical record.  No pertanent information unless stated regarding to the chief complaint.   Review of Systems:Review of systems updated and as accurate as of 06/26/17  No headache, visual changes, nausea, vomiting, diarrhea, constipation, dizziness, abdominal pain, skin rash, fevers, chills, night sweats, weight loss, swollen lymph nodes, body aches, joint swelling, muscle aches, chest pain, shortness of breath, mood changes.   Objective  Blood pressure 110/78, pulse 62, height 5\' 4"  (1.626 m), weight 165  lb (74.8 kg), SpO2 99 %. Systems examined below as of 06/26/17   General: No apparent distress alert and oriented x3 mood and affect normal, dressed appropriately.  HEENT: Pupils equal, extraocular movements intact  Respiratory: Patient's speak in full sentences and does not appear short of breath  Cardiovascular: No lower extremity edema, non tender, no erythema  Skin: Warm dry intact with no signs of infection or rash on extremities or on axial skeleton.  Abdomen: Soft nontender  Neuro: Cranial nerves II through XII are intact, neurovascularly intact in all extremities with 2+ DTRs and 2+ pulses.  Lymph: No lymphadenopathy of posterior or anterior cervical chain or axillae bilaterally.  Gait normal with good balance and coordination.  MSK:  Non tender with full range of motion and good stability and symmetric strength and tone of  elbows, wrist, hip, knee and ankles bilaterally.   Shoulder: Right Inspection reveals no abnormalities, atrophy or asymmetry. Palpation is normal with no tenderness over AC joint or bicipital groove. ROM is full in all planes. Rotator cuff strength normal throughout. No signs of impingement with negative Neer and Hawkin's tests, empty can sign. Speeds and Yergason's tests normal. No labral pathology noted with negative Obrien's, negative clunk and good stability. Normal scapular function observed. No painful arc and no drop arm sign. No apprehension sign  Contralateral shoulder unremarkable     Impression and Recommendations:     This case required  medical decision making of moderate complexity.      Note: This dictation was prepared with Dragon dictation along with smaller phrase technology. Any transcriptional errors that result from this process are unintentional.

## 2017-06-26 ENCOUNTER — Encounter: Payer: Self-pay | Admitting: Family Medicine

## 2017-06-26 ENCOUNTER — Ambulatory Visit (INDEPENDENT_AMBULATORY_CARE_PROVIDER_SITE_OTHER): Payer: BC Managed Care – PPO | Admitting: Family Medicine

## 2017-06-26 DIAGNOSIS — S42034D Nondisplaced fracture of lateral end of right clavicle, subsequent encounter for fracture with routine healing: Secondary | ICD-10-CM | POA: Diagnosis not present

## 2017-06-26 NOTE — Assessment & Plan Note (Signed)
Doing well.  Continued with patient to conservative therapy including vitamin D.  Unable to start contact sports again.  Will monitor.  Follow-up as needed

## 2018-04-26 ENCOUNTER — Encounter: Payer: Self-pay | Admitting: Family Medicine

## 2018-04-26 ENCOUNTER — Ambulatory Visit: Payer: Self-pay

## 2018-04-26 ENCOUNTER — Ambulatory Visit (INDEPENDENT_AMBULATORY_CARE_PROVIDER_SITE_OTHER): Payer: BC Managed Care – PPO | Admitting: Family Medicine

## 2018-04-26 VITALS — BP 98/60 | HR 65 | Ht 64.0 in | Wt 161.0 lb

## 2018-04-26 DIAGNOSIS — M25561 Pain in right knee: Secondary | ICD-10-CM

## 2018-04-26 DIAGNOSIS — S83511A Sprain of anterior cruciate ligament of right knee, initial encounter: Secondary | ICD-10-CM

## 2018-04-26 DIAGNOSIS — G8929 Other chronic pain: Secondary | ICD-10-CM

## 2018-04-26 NOTE — Patient Instructions (Signed)
Good to see you  Ice is your friend Dr. Myrna Blazer ortho will be great  I am here for questions

## 2018-04-26 NOTE — Progress Notes (Signed)
Veronica ScaleZach  D.O. [facility] [address] [place], [place] [address] Phone: [phone] Subjective:   Veronica Robertson, Veronica Robertson, am serving as a scribe for Dr. Antoine PrimasZachary .   CC: Right knee pain  BJY:[id number]:Subjective  Veronica ConnorsMichelle Robertson is a 28 y.o. female coming in with complaint of right knee pain. Tore ACL in 2017. Has taken a few hits that cause pain and instablity. Feels like she is unable to fully participate in her sport due to not having an ACL. Wants to discuss surgery.  Patient had another accident in December patient had this accident and since then her knee has seemed significantly more unstable.  Patient states that it seems to have more swelling.  Discussed icing regimen which she has been doing intermittently.  Patient has a custom brace but states that she does not want to wear it as regularly as she has been having to.  Wanting to know if surgical intervention would be beneficial    Patient is an MRI from 2017.  Found to have a complete tear of the ACL and a grade 2 fibular collateral ligament  Past Medical History:  Diagnosis Date  . Broken arm    left   Past Surgical History:  Procedure Laterality Date  . arm surgery     Social History   Socioeconomic History  . Marital status: Single    Spouse name: Not on file  . Number of children: Not on file  . Years of education: Not on file  . Highest education level: Not on file  Occupational History  . Not on file  Social Needs  . Financial resource strain: Not on file  . Food insecurity:    Worry: Not on file    Inability: Not on file  . Transportation needs:    Medical: Not on file    Non-medical: Not on file  Tobacco Use  . Smoking status: Never Smoker  . Smokeless tobacco: Never Used  Substance and Sexual Activity  . Alcohol use: Yes  . Drug use: No  . Sexual activity: Not on file  Lifestyle  . Physical activity:    Days per week: Not on file    Minutes per session: Not on file  . Stress: Not on  file  Relationships  . Social connections:    Talks on phone: Not on file    Gets together: Not on file    Attends religious service: Not on file    Active member of club or organization: Not on file    Attends meetings of clubs or organizations: Not on file    Relationship status: Not on file  Other Topics Concern  . Not on file  Social History Narrative  . Not on file   No Known Allergies No family history on file.     Current Outpatient Medications (Analgesics):  .  ibuprofen (ADVIL,MOTRIN) 600 MG tablet, Take 1 tablet (600 mg total) by mouth every 6 (six) hours as needed. (Patient not taking: Reported on 06/26/2017)   Current Outpatient Medications (Other):  Marland Kitchen.  Vitamin D, Ergocalciferol, (DRISDOL) 50000 units CAPS capsule, Take 1 capsule (50,000 Units total) by mouth every 7 (seven) days.    Past medical history, social, surgical and family history all reviewed in electronic medical record.  No pertanent information unless stated regarding to the chief complaint.   Review of Systems:  No headache, visual changes, nausea, vomiting, diarrhea, constipation, dizziness, abdominal pain, skin rash, fevers, chills, night sweats, weight loss, swollen  lymph nodes, body aches, joint swelling, muscle aches, chest pain, shortness of breath, mood changes.   Objective  There were no vitals taken for this visit. Systems examined below as of    General: No apparent distress alert and oriented x3 mood and affect normal, dressed appropriately.  HEENT: Pupils equal, extraocular movements intact  Respiratory: Patient's speak in full sentences and does not appear short of breath  Cardiovascular: No lower extremity edema, non tender, no erythema  Skin: Warm dry intact with no signs of infection or rash on extremities or on axial skeleton.  Abdomen: Soft nontender  Neuro: Cranial nerves II through XII are intact, neurovascularly intact in all extremities with 2+ DTRs and 2+ pulses.  Lymph: No  lymphadenopathy of posterior or anterior cervical chain or axillae bilaterally.  Gait mild antalgic MSK:  Non tender with full range of motion and good stability and symmetric strength and tone of shoulders, elbows, wrist, hip, knee and ankles bilaterally.  Right knee exam shows significant positive drawer and new finding of a loosening of the PCL as well.  Patient also has what appears to be a full-thickness tear of the LCL with significant laxity.  Patient's extensor mechanism is intact.  Hamstring strength is symmetric.  With ambulation some mild instability of the knee is noted.    Impression and Recommendations:      The above documentation has been reviewed and is accurate and complete Judi SaaZachary M , DO       Note: This dictation was prepared with Dragon dictation along with smaller phrase technology. Any transcriptional errors that result from this process are unintentional.

## 2019-06-16 ENCOUNTER — Ambulatory Visit: Payer: BC Managed Care – PPO | Attending: Internal Medicine

## 2019-06-16 DIAGNOSIS — Z23 Encounter for immunization: Secondary | ICD-10-CM | POA: Insufficient documentation

## 2019-06-16 NOTE — Progress Notes (Signed)
   Covid-19 Vaccination Clinic  Name:  Veronica Robertson    MRN: [id number] DOB: 1990-11-26  06/16/2019  [name] was observed post Covid-19 immunization for 15 minutes without incident. She was provided with Vaccine Information Sheet and instruction to access the V-Safe system.   [name] was instructed to call 911 with any severe reactions post vaccine: Marland Kitchen Difficulty breathing  . Swelling of face and throat  . A fast heartbeat  . A bad rash all over body  . Dizziness and weakness   Immunizations Administered    Name Date Dose VIS Date Route   Pfizer COVID-19 Vaccine 06/16/2019  4:39 PM 0.3 mL 03/22/2019 Intramuscular   Manufacturer: [employer], [employer]   Lot: [id number]   NDC: [address]-7

## 2019-07-07 ENCOUNTER — Ambulatory Visit: Payer: BC Managed Care – PPO | Attending: Internal Medicine

## 2019-07-07 DIAGNOSIS — Z23 Encounter for immunization: Secondary | ICD-10-CM

## 2019-07-07 NOTE — Progress Notes (Signed)
   Covid-19 Vaccination Clinic  Name:  Veronica Robertson    MRN: [id number] DOB: [birth year]  07/07/2019  [name] was observed post Covid-19 immunization for 15 minutes without incident. She was provided with Vaccine Information Sheet and instruction to access the V-Safe system.   [name] was instructed to call 911 with any severe reactions post vaccine: Marland Kitchen Difficulty breathing  . Swelling of face and throat  . A fast heartbeat  . A bad rash all over body  . Dizziness and weakness   Immunizations Administered    Name Date Dose VIS Date Route   Pfizer COVID-19 Vaccine 07/07/2019  3:52 PM 0.3 mL 03/22/2019 Intramuscular   Manufacturer: [employer], [employer]   Lot: [id number]   NDC: [address]-1
